# Patient Record
Sex: Female | Born: 1985 | Race: White | Hispanic: No | Marital: Single
Health system: Southern US, Community
[De-identification: ages and names within clinical notes are randomized; demographics above are authoritative.]

---
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
## 2005-04-09 ENCOUNTER — Ambulatory Visit: Payer: Self-pay | Admitting: Obstetrics & Gynecology

## 2006-05-01 ENCOUNTER — Emergency Department: Payer: Self-pay | Admitting: Emergency Medicine

## 2006-06-25 ENCOUNTER — Emergency Department: Payer: Self-pay | Admitting: Emergency Medicine

## 2006-10-29 ENCOUNTER — Ambulatory Visit: Payer: Self-pay | Admitting: Gynecologic Oncology

## 2007-05-18 ENCOUNTER — Emergency Department: Payer: Self-pay | Admitting: Unknown Physician Specialty

## 2007-06-04 ENCOUNTER — Ambulatory Visit: Payer: Self-pay | Admitting: Unknown Physician Specialty

## 2007-09-25 ENCOUNTER — Emergency Department: Payer: Self-pay | Admitting: Emergency Medicine

## 2007-09-27 ENCOUNTER — Inpatient Hospital Stay: Payer: Self-pay | Admitting: Otolaryngology

## 2007-12-03 IMAGING — US US PELV - US TRANSVAGINAL
1 series · 11 of 11 positions shown · non-contrast
Comparison: none

REASON FOR EXAM: pelvic pain    Per Bl[facility] Jumper send Begga to [facility]
COMMENTS:

[Series 1: us pelv - us transvaginal · 11 of 11 slices shown]
[im 1/11]
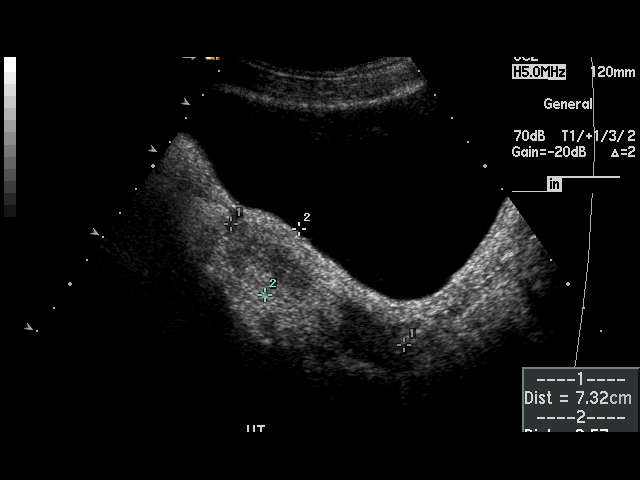
[im 2/11]
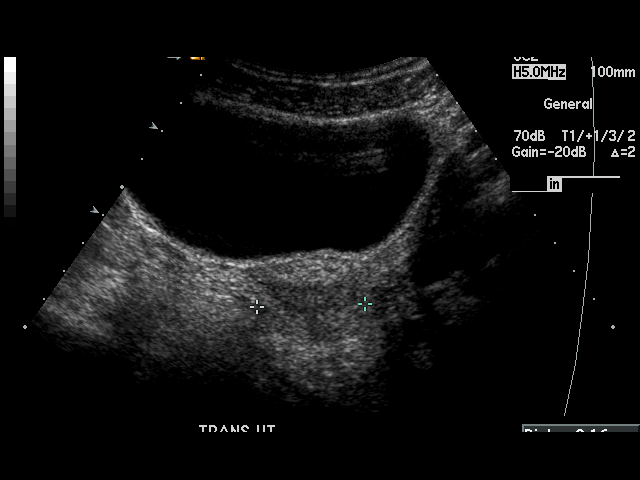
[im 3/11]
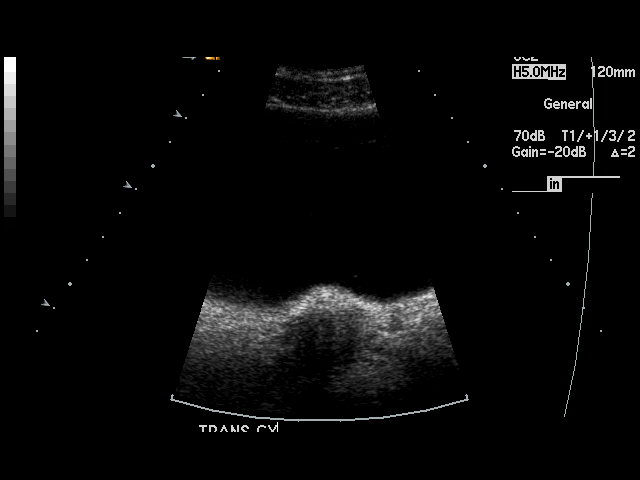
[im 4/11]
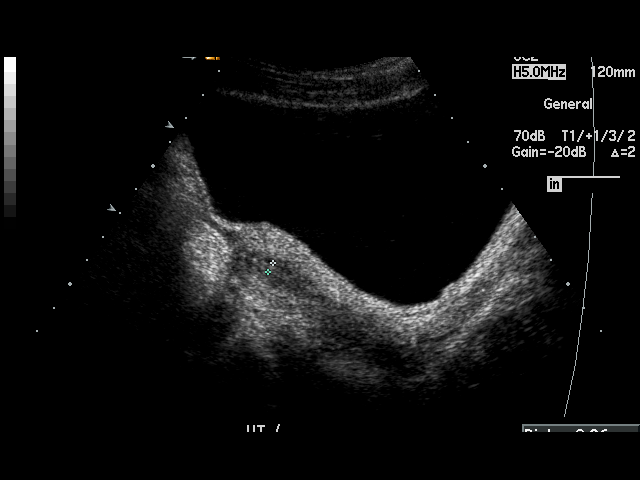
[im 5/11]
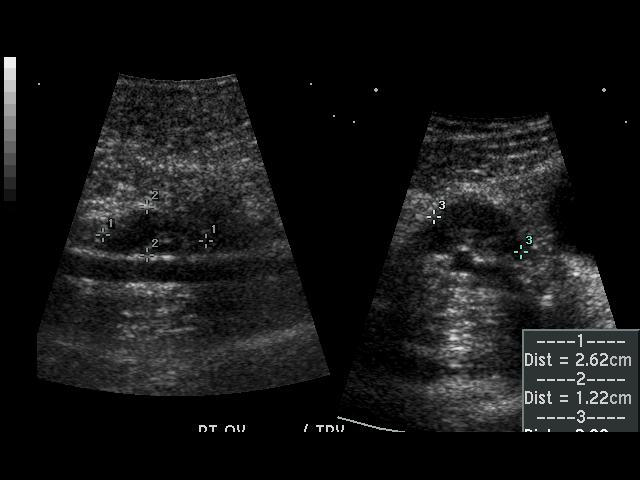
[im 6/11]
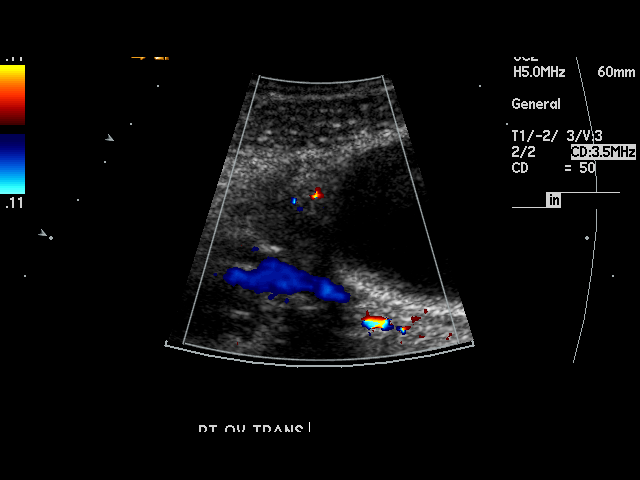
[im 7/11]
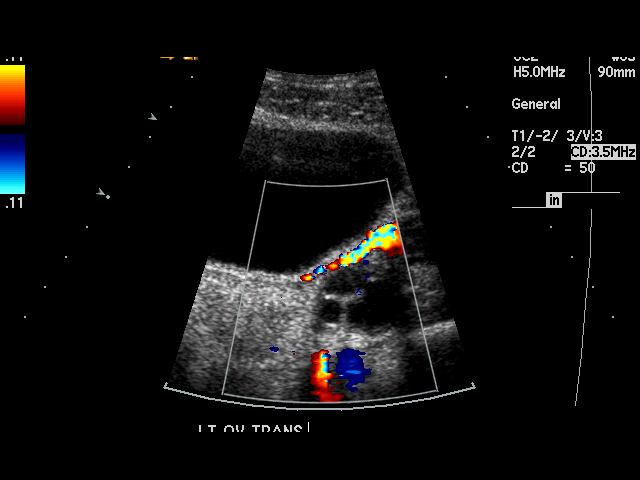
[im 8/11]
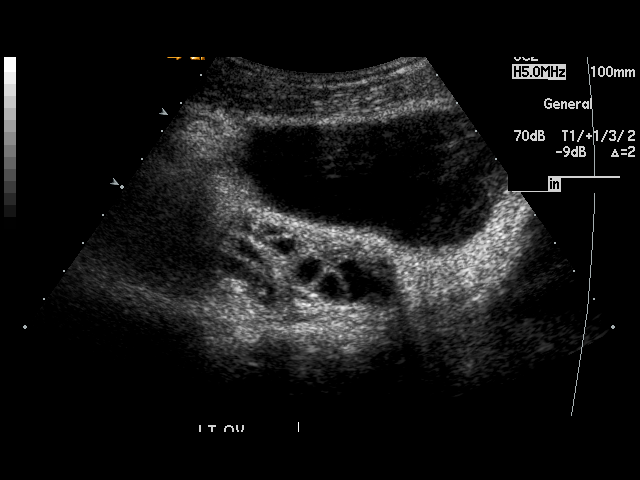
[im 9/11]
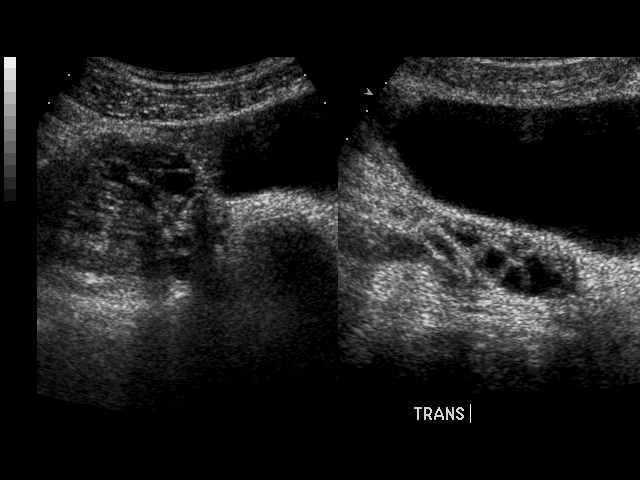
[im 10/11]
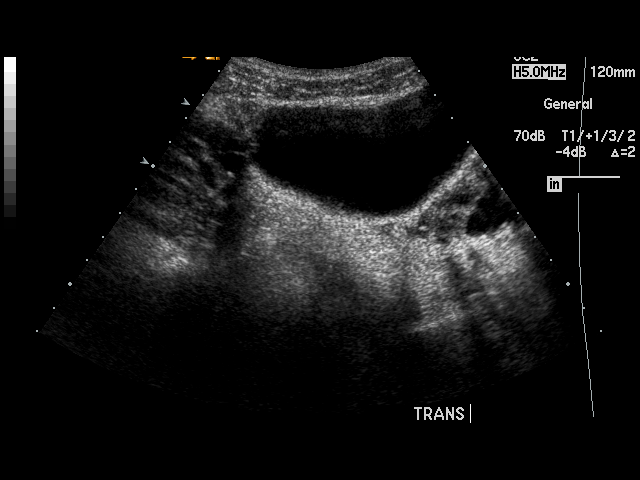
[im 11/11]
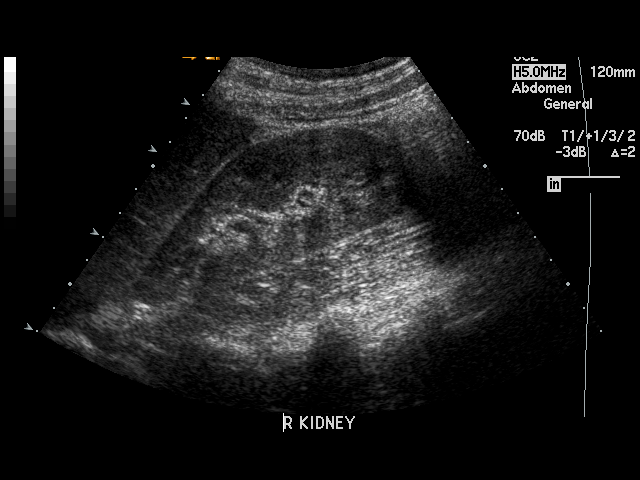

[11 of 11 positions shown; findings below may reference images not displayed]

PROCEDURE:     US  - US PELVIS MASS EXAM  - [DATE] [DATE] [DATE]  [DATE]

RESULT:     Transabdominal pelvic ultrasound was performed. The uterus
measures 7.32 cm x 2.57 cm x 3.16 cm.  No uterine mass lesions are seen. The
endometrium measures 3.6 mm in thickness. The RIGHT and LEFT ovaries are
visualized. The RIGHT ovary measures 2.62 cm at maximum diameter and the
LEFT ovary measures 3.47 cm at maximum diameter. Multiple follicular cysts
are seen in the LEFT ovary. No abnormal adnexal masses are identified. There
is no free fluid in the pelvis. The visualized portions of the urinary
bladder are normal appearance.
IMPRESSION: 1)There are multiple cysts of the LEFT ovary with the largest measuring
cm at maximum diameter and which are considered consistent with follicular
cysts. No follicular cysts are identified in the RIGHT ovary.
2. No uterine mass lesions are seen.
3. There is no free fluid observed in the pelvis.

## 2007-12-19 ENCOUNTER — Emergency Department: Payer: Self-pay | Admitting: Emergency Medicine

## 2008-01-01 ENCOUNTER — Emergency Department: Payer: Self-pay | Admitting: Emergency Medicine

## 2008-02-17 ENCOUNTER — Emergency Department: Payer: Self-pay | Admitting: Internal Medicine

## 2008-06-14 ENCOUNTER — Observation Stay: Payer: Self-pay

## 2011-12-06 ENCOUNTER — Emergency Department: Payer: Self-pay | Admitting: Emergency Medicine

## 2012-08-16 ENCOUNTER — Emergency Department: Payer: Self-pay | Admitting: Internal Medicine

## 2013-02-14 IMAGING — CR DG CHEST 2V
1 series · 2 of 2 positions shown · non-contrast
Comparison: none

REASON FOR EXAM: cough/wheezing
COMMENTS:

PROCEDURE:     DXR - DXR CHEST PA (OR AP) AND LATERAL  - August 16, 2012  [DATE]
RESULT:     Comparison: None.

[Series 1: w chest pa · 0.14mm/px · 2 of 2 slices shown]
[im 1/2]
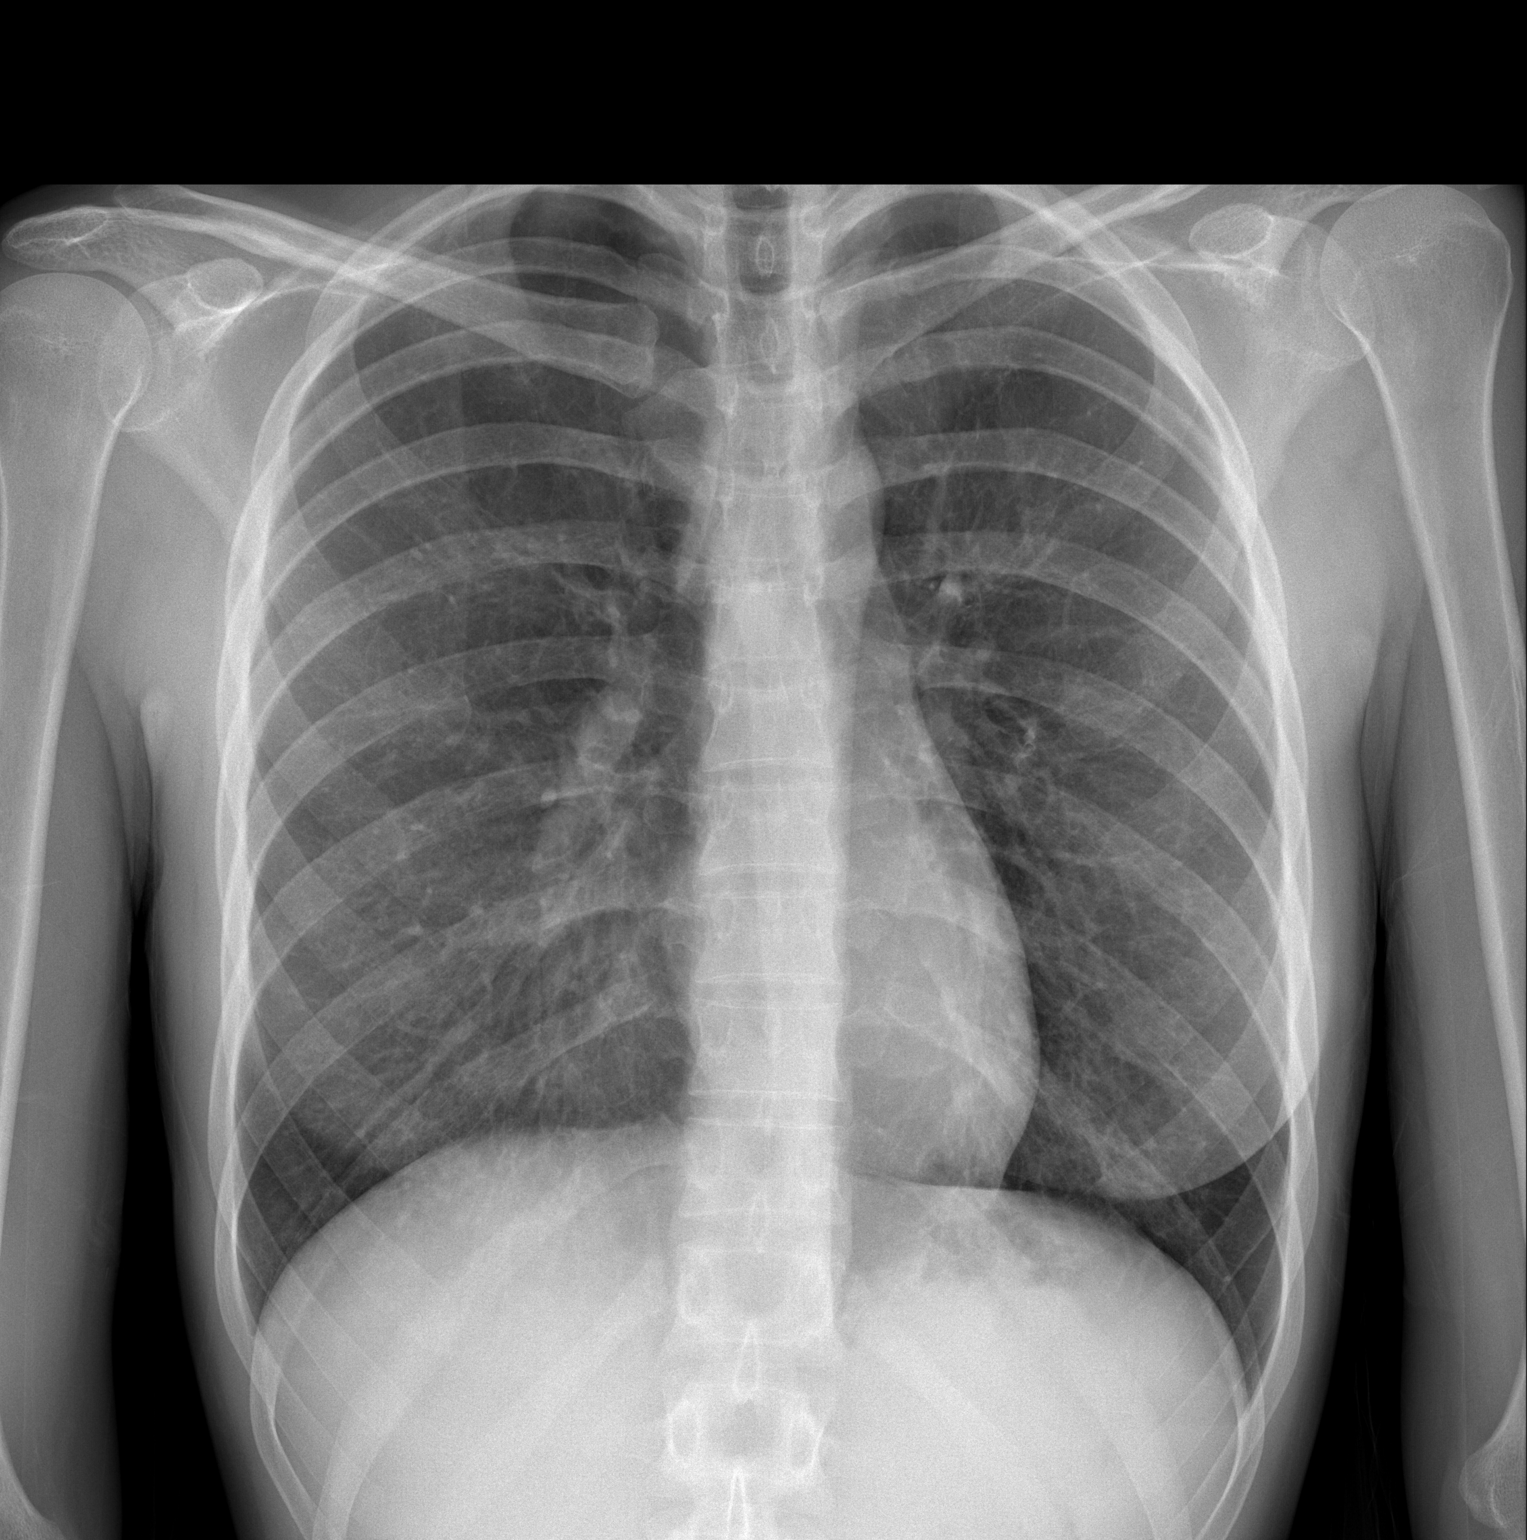
[im 2/2]
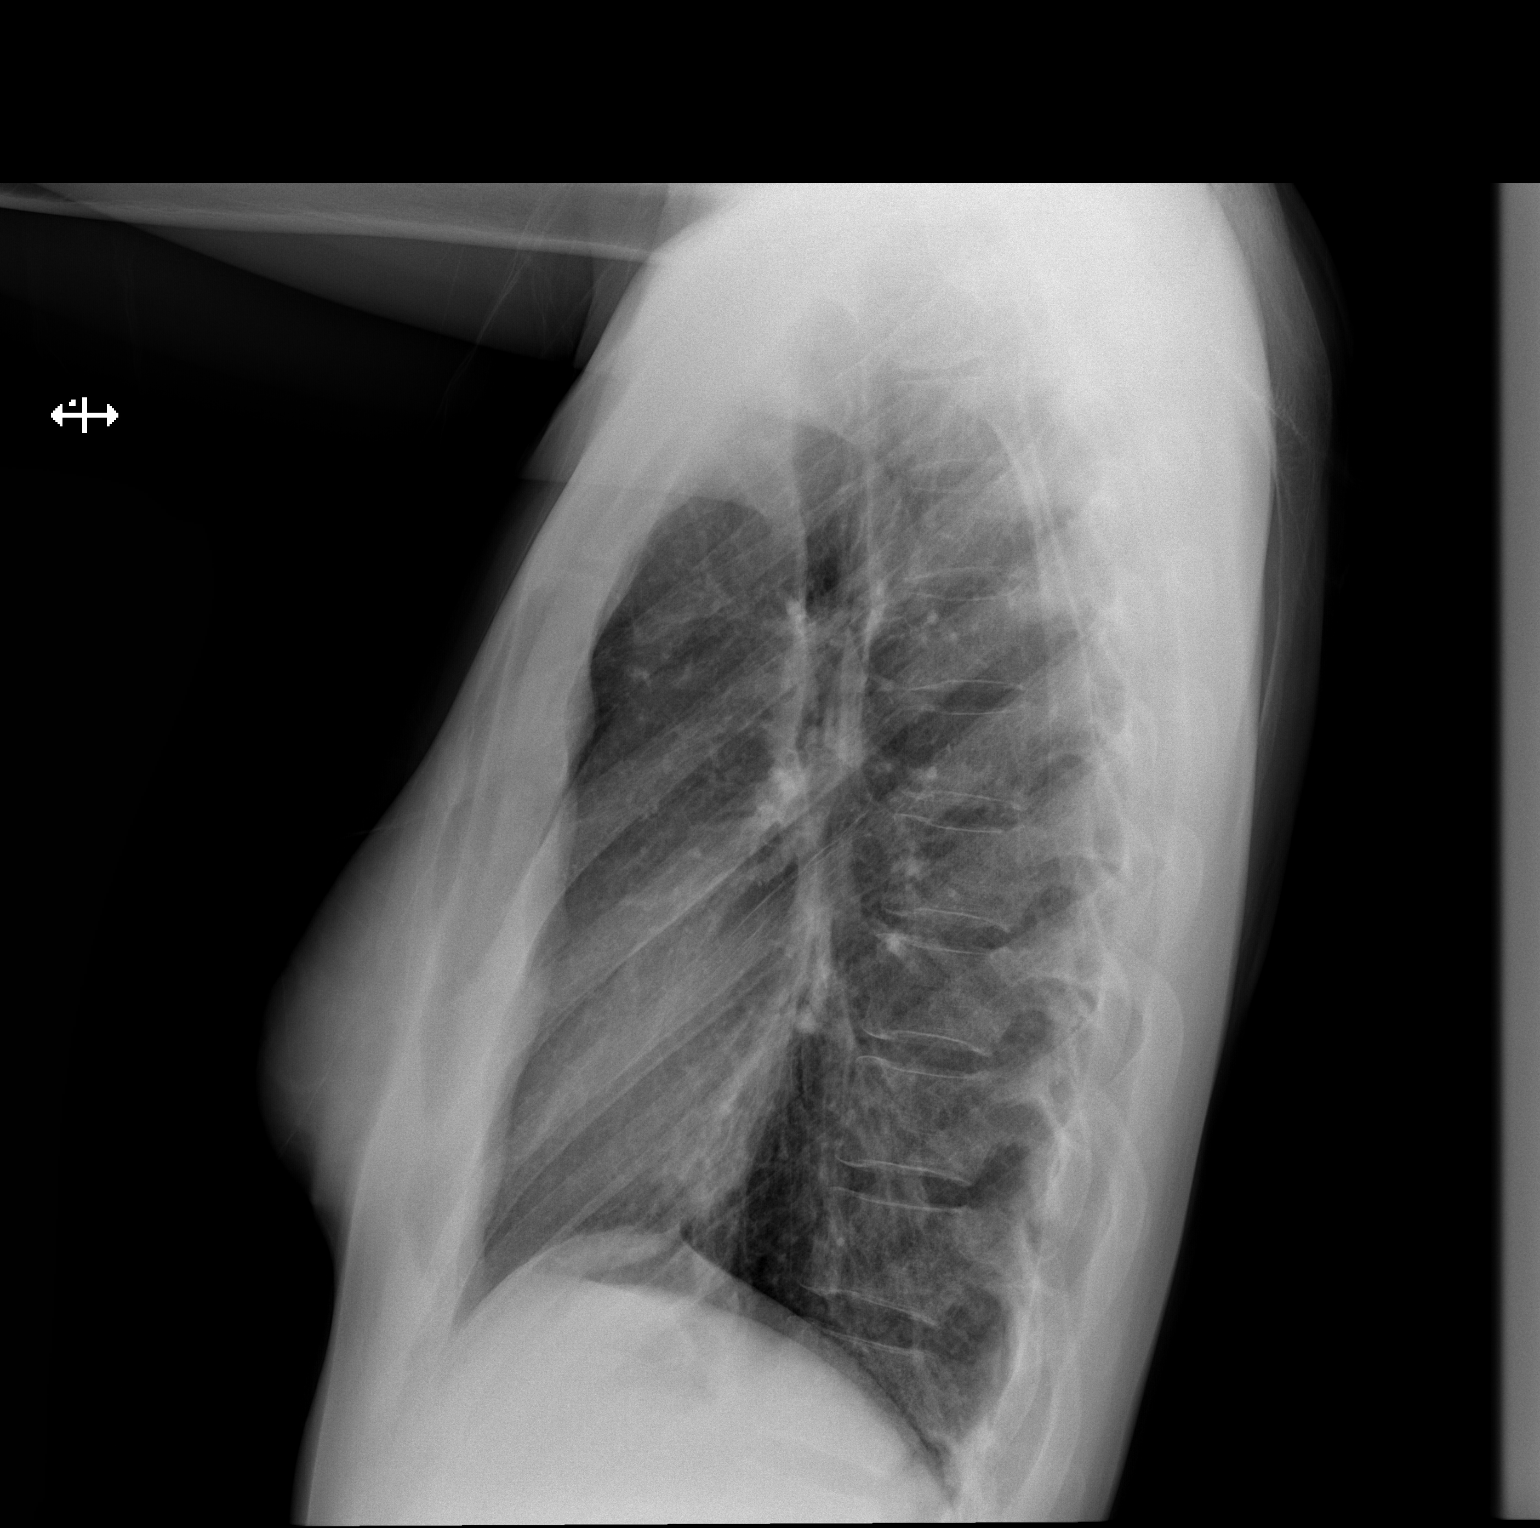

[2 of 2 positions shown; findings below may reference images not displayed]

FINDINGS: The heart and mediastinum are within normal limits. There is a subtle
opacity along the posterior aspect of the heart lateral view which could
represent a small area of atelectasis or early infection. This may correlate
with the left lower lobe on the frontal view.
IMPRESSION: Subtle area of opacity, possibly in the left lower lobe, may represent
atelectasis or early infection. If there is continued symptomatology,
followup radiographs could be performed.

[REDACTED]

## 2013-06-17 LAB — COMPREHENSIVE METABOLIC PANEL
Albumin: 4.1 g/dL (ref 3.4–5.0)
Alkaline Phosphatase: 79 U/L (ref 50–136)
Anion Gap: 9 (ref 7–16)
BUN: 4 mg/dL — ABNORMAL LOW (ref 7–18)
Bilirubin,Total: 0.3 mg/dL (ref 0.2–1.0)
Calcium, Total: 8.7 mg/dL (ref 8.5–10.1)
Chloride: 110 mmol/L — ABNORMAL HIGH (ref 98–107)
Glucose: 93 mg/dL (ref 65–99)
Osmolality: 282 (ref 275–301)
Potassium: 3.4 mmol/L — ABNORMAL LOW (ref 3.5–5.1)
SGPT (ALT): 26 U/L (ref 12–78)
Sodium: 143 mmol/L (ref 136–145)
Total Protein: 8 g/dL (ref 6.4–8.2)

## 2013-06-17 LAB — URINALYSIS, COMPLETE
Glucose,UR: NEGATIVE mg/dL (ref 0–75)
Nitrite: NEGATIVE
Ph: 7 (ref 4.5–8.0)
Protein: NEGATIVE
RBC,UR: <1 /HPF (ref 0–5)
Specific Gravity: 1.002 (ref 1.003–1.030)
WBC UR: <1 /HPF (ref 0–5)

## 2013-06-17 LAB — CBC
HCT: 40.4 % (ref 35.0–47.0)
HGB: 13.9 g/dL (ref 12.0–16.0)
MCH: 31.5 pg (ref 26.0–34.0)
MCHC: 34.4 g/dL (ref 32.0–36.0)
MCV: 92 fL (ref 80–100)
Platelet: 163 10*3/uL (ref 150–440)
RBC: 4.42 10*6/uL (ref 3.80–5.20)
RDW: 12.9 % (ref 11.5–14.5)
WBC: 5.4 10*3/uL (ref 3.6–11.0)

## 2013-06-17 LAB — DRUG SCREEN, URINE
Barbiturates, Ur Screen: NEGATIVE (ref ?–200)
Benzodiazepine, Ur Scrn: NEGATIVE (ref ?–200)
Cocaine Metabolite,Ur ~~LOC~~: NEGATIVE (ref ?–300)
MDMA (Ecstasy)Ur Screen: NEGATIVE (ref ?–500)
Methadone, Ur Screen: NEGATIVE (ref ?–300)
Opiate, Ur Screen: NEGATIVE (ref ?–300)
Tricyclic, Ur Screen: NEGATIVE (ref ?–1000)

## 2013-06-17 LAB — ACETAMINOPHEN LEVEL: Acetaminophen: <2 ug/mL

## 2013-06-17 LAB — ETHANOL
Ethanol %: 0.182 % — ABNORMAL HIGH (ref 0.000–0.080)
Ethanol: 182 mg/dL

## 2013-06-17 LAB — PREGNANCY, URINE: Pregnancy Test, Urine: NEGATIVE m[IU]/mL

## 2013-06-17 LAB — SALICYLATE LEVEL: Salicylates, Serum: 3.9 mg/dL — ABNORMAL HIGH

## 2013-06-18 ENCOUNTER — Inpatient Hospital Stay: Payer: Self-pay | Admitting: Psychiatry

## 2014-03-14 ENCOUNTER — Emergency Department: Payer: Self-pay | Admitting: Emergency Medicine

## 2014-03-14 LAB — COMPREHENSIVE METABOLIC PANEL
ANION GAP: 4 — AB (ref 7–16)
Albumin: 4 g/dL (ref 3.4–5.0)
Alkaline Phosphatase: 51 U/L
BUN: 6 mg/dL — ABNORMAL LOW (ref 7–18)
Bilirubin,Total: 0.6 mg/dL (ref 0.2–1.0)
CHLORIDE: 106 mmol/L (ref 98–107)
Calcium, Total: 8.6 mg/dL (ref 8.5–10.1)
Co2: 25 mmol/L (ref 21–32)
Creatinine: 0.68 mg/dL (ref 0.60–1.30)
EGFR (African American): >60
Glucose: 88 mg/dL (ref 65–99)
OSMOLALITY: 267 (ref 275–301)
Potassium: 3.6 mmol/L (ref 3.5–5.1)
SGOT(AST): 24 U/L (ref 15–37)
SGPT (ALT): 24 U/L (ref 12–78)
Sodium: 135 mmol/L — ABNORMAL LOW (ref 136–145)
TOTAL PROTEIN: 7.6 g/dL (ref 6.4–8.2)

## 2014-03-14 LAB — URINALYSIS, COMPLETE
BILIRUBIN, UR: NEGATIVE
Bacteria: NONE SEEN
Glucose,UR: NEGATIVE mg/dL (ref 0–75)
Ketone: NEGATIVE
NITRITE: NEGATIVE
Ph: 7 (ref 4.5–8.0)
Protein: NEGATIVE
Specific Gravity: 1.017 (ref 1.003–1.030)
WBC UR: 2 /HPF (ref 0–5)

## 2014-03-14 LAB — CBC WITH DIFFERENTIAL/PLATELET
BASOS PCT: 0.4 %
Basophil #: 0 10*3/uL (ref 0.0–0.1)
EOS ABS: 0 10*3/uL (ref 0.0–0.7)
Eosinophil %: 0.3 %
HCT: 36.7 % (ref 35.0–47.0)
HGB: 11.9 g/dL — ABNORMAL LOW (ref 12.0–16.0)
LYMPHS PCT: 30.6 %
Lymphocyte #: 1.5 10*3/uL (ref 1.0–3.6)
MCH: 29.5 pg (ref 26.0–34.0)
MCHC: 32.5 g/dL (ref 32.0–36.0)
MCV: 91 fL (ref 80–100)
MONO ABS: 0.4 x10 3/mm (ref 0.2–0.9)
Monocyte %: 8.7 %
Neutrophil #: 3 10*3/uL (ref 1.4–6.5)
Neutrophil %: 60 %
Platelet: 160 10*3/uL (ref 150–440)
RBC: 4.04 10*6/uL (ref 3.80–5.20)
RDW: 14.5 % (ref 11.5–14.5)
WBC: 4.9 10*3/uL (ref 3.6–11.0)

## 2014-03-29 ENCOUNTER — Emergency Department: Payer: Self-pay | Admitting: Emergency Medicine

## 2014-03-29 LAB — URINALYSIS, COMPLETE
BILIRUBIN, UR: NEGATIVE
Blood: NEGATIVE
GLUCOSE, UR: NEGATIVE mg/dL (ref 0–75)
LEUKOCYTE ESTERASE: NEGATIVE
NITRITE: NEGATIVE
Ph: 6 (ref 4.5–8.0)
Protein: NEGATIVE
RBC,UR: <1 /HPF (ref 0–5)
Specific Gravity: 1.019 (ref 1.003–1.030)
Squamous Epithelial: 1

## 2014-03-29 LAB — COMPREHENSIVE METABOLIC PANEL
ALT: 18 U/L (ref 12–78)
Albumin: 4.1 g/dL (ref 3.4–5.0)
Alkaline Phosphatase: 49 U/L
Anion Gap: 8 (ref 7–16)
BILIRUBIN TOTAL: 0.6 mg/dL (ref 0.2–1.0)
BUN: 5 mg/dL — AB (ref 7–18)
CALCIUM: 8.7 mg/dL (ref 8.5–10.1)
Chloride: 108 mmol/L — ABNORMAL HIGH (ref 98–107)
Co2: 21 mmol/L (ref 21–32)
Creatinine: 0.36 mg/dL — ABNORMAL LOW (ref 0.60–1.30)
EGFR (African American): >60
EGFR (Non-African Amer.): >60
GLUCOSE: 92 mg/dL (ref 65–99)
OSMOLALITY: 271 (ref 275–301)
Potassium: 3.4 mmol/L — ABNORMAL LOW (ref 3.5–5.1)
SGOT(AST): 19 U/L (ref 15–37)
SODIUM: 137 mmol/L (ref 136–145)
TOTAL PROTEIN: 7.8 g/dL (ref 6.4–8.2)

## 2014-03-29 LAB — CBC WITH DIFFERENTIAL/PLATELET
Basophil #: 0 10*3/uL (ref 0.0–0.1)
Basophil %: 0.2 %
EOS PCT: 0.6 %
Eosinophil #: 0 10*3/uL (ref 0.0–0.7)
HCT: 37.7 % (ref 35.0–47.0)
HGB: 12.9 g/dL (ref 12.0–16.0)
LYMPHS ABS: 1.8 10*3/uL (ref 1.0–3.6)
Lymphocyte %: 28.8 %
MCH: 31.1 pg (ref 26.0–34.0)
MCHC: 34.2 g/dL (ref 32.0–36.0)
MCV: 91 fL (ref 80–100)
MONOS PCT: 6.7 %
Monocyte #: 0.4 x10 3/mm (ref 0.2–0.9)
Neutrophil #: 4 10*3/uL (ref 1.4–6.5)
Neutrophil %: 63.7 %
Platelet: 169 10*3/uL (ref 150–440)
RBC: 4.16 10*6/uL (ref 3.80–5.20)
RDW: 14.2 % (ref 11.5–14.5)
WBC: 6.2 10*3/uL (ref 3.6–11.0)

## 2014-03-29 LAB — HCG, QUANTITATIVE, PREGNANCY: BETA HCG, QUANT.: 87942 m[IU]/mL — AB

## 2014-03-29 LAB — LIPASE, BLOOD: Lipase: 170 U/L (ref 73–393)

## 2014-04-27 ENCOUNTER — Emergency Department: Payer: Self-pay | Admitting: Emergency Medicine

## 2014-04-27 LAB — URINALYSIS, COMPLETE
BACTERIA: NONE SEEN
Bilirubin,UR: NEGATIVE
Blood: NEGATIVE
GLUCOSE, UR: NEGATIVE mg/dL (ref 0–75)
Leukocyte Esterase: NEGATIVE
Nitrite: NEGATIVE
PROTEIN: NEGATIVE
Ph: 6 (ref 4.5–8.0)
RBC,UR: 2 /HPF (ref 0–5)
Specific Gravity: 1.026 (ref 1.003–1.030)
Squamous Epithelial: 7
WBC UR: 5 /HPF (ref 0–5)

## 2014-04-27 LAB — COMPREHENSIVE METABOLIC PANEL
ANION GAP: 7 (ref 7–16)
Albumin: 3.9 g/dL (ref 3.4–5.0)
Alkaline Phosphatase: 56 U/L
BILIRUBIN TOTAL: 0.7 mg/dL (ref 0.2–1.0)
BUN: 8 mg/dL (ref 7–18)
CREATININE: 0.56 mg/dL — AB (ref 0.60–1.30)
Calcium, Total: 9.4 mg/dL (ref 8.5–10.1)
Chloride: 102 mmol/L (ref 98–107)
Co2: 26 mmol/L (ref 21–32)
EGFR (African American): >60
Glucose: 109 mg/dL — ABNORMAL HIGH (ref 65–99)
OSMOLALITY: 269 (ref 275–301)
Potassium: 3.4 mmol/L — ABNORMAL LOW (ref 3.5–5.1)
SGOT(AST): 15 U/L (ref 15–37)
SGPT (ALT): 17 U/L (ref 12–78)
Sodium: 135 mmol/L — ABNORMAL LOW (ref 136–145)
Total Protein: 8.1 g/dL (ref 6.4–8.2)

## 2014-04-27 LAB — CBC WITH DIFFERENTIAL/PLATELET
BASOS ABS: 0 10*3/uL (ref 0.0–0.1)
Basophil %: 0.1 %
EOS ABS: 0 10*3/uL (ref 0.0–0.7)
EOS PCT: 0.1 %
HCT: 37.6 % (ref 35.0–47.0)
HGB: 13 g/dL (ref 12.0–16.0)
Lymphocyte #: 0.5 10*3/uL — ABNORMAL LOW (ref 1.0–3.6)
Lymphocyte %: 8 %
MCH: 31.5 pg (ref 26.0–34.0)
MCHC: 34.7 g/dL (ref 32.0–36.0)
MCV: 91 fL (ref 80–100)
MONOS PCT: 3.8 %
Monocyte #: 0.2 x10 3/mm (ref 0.2–0.9)
NEUTROS PCT: 88 %
Neutrophil #: 5.7 10*3/uL (ref 1.4–6.5)
PLATELETS: 172 10*3/uL (ref 150–440)
RBC: 4.14 10*6/uL (ref 3.80–5.20)
RDW: 13.5 % (ref 11.5–14.5)
WBC: 6.5 10*3/uL (ref 3.6–11.0)

## 2014-06-07 ENCOUNTER — Emergency Department: Payer: Self-pay | Admitting: Emergency Medicine

## 2014-06-07 LAB — URINALYSIS, COMPLETE
Bilirubin,UR: NEGATIVE
Blood: NEGATIVE
Glucose,UR: NEGATIVE mg/dL (ref 0–75)
Leukocyte Esterase: NEGATIVE
Nitrite: NEGATIVE
PH: 5 (ref 4.5–8.0)
Protein: 30
RBC,UR: 3 /HPF (ref 0–5)
Specific Gravity: 1.026 (ref 1.003–1.030)
Squamous Epithelial: 3

## 2014-06-07 LAB — COMPREHENSIVE METABOLIC PANEL
ALK PHOS: 61 U/L
Albumin: 3.4 g/dL (ref 3.4–5.0)
Anion Gap: 14 (ref 7–16)
BILIRUBIN TOTAL: 0.6 mg/dL (ref 0.2–1.0)
BUN: 3 mg/dL — AB (ref 7–18)
Calcium, Total: 9 mg/dL (ref 8.5–10.1)
Chloride: 100 mmol/L (ref 98–107)
Co2: 20 mmol/L — ABNORMAL LOW (ref 21–32)
Creatinine: 0.5 mg/dL — ABNORMAL LOW (ref 0.60–1.30)
EGFR (African American): >60
EGFR (Non-African Amer.): >60
Glucose: 103 mg/dL — ABNORMAL HIGH (ref 65–99)
Osmolality: 265 (ref 275–301)
Potassium: 3 mmol/L — ABNORMAL LOW (ref 3.5–5.1)
SGOT(AST): 16 U/L (ref 15–37)
SGPT (ALT): 18 U/L (ref 12–78)
Sodium: 134 mmol/L — ABNORMAL LOW (ref 136–145)
Total Protein: 7.7 g/dL (ref 6.4–8.2)

## 2014-06-07 LAB — CBC WITH DIFFERENTIAL/PLATELET
BASOS PCT: 0.2 %
Basophil #: 0 10*3/uL (ref 0.0–0.1)
EOS ABS: 0 10*3/uL (ref 0.0–0.7)
EOS PCT: 0.1 %
HCT: 35 % (ref 35.0–47.0)
HGB: 12 g/dL (ref 12.0–16.0)
Lymphocyte #: 0.3 10*3/uL — ABNORMAL LOW (ref 1.0–3.6)
Lymphocyte %: 2.8 %
MCH: 31.3 pg (ref 26.0–34.0)
MCHC: 34.2 g/dL (ref 32.0–36.0)
MCV: 92 fL (ref 80–100)
Monocyte #: 0.3 x10 3/mm (ref 0.2–0.9)
Monocyte %: 3.4 %
NEUTROS PCT: 93.5 %
Neutrophil #: 8.6 10*3/uL — ABNORMAL HIGH (ref 1.4–6.5)
Platelet: 171 10*3/uL (ref 150–440)
RBC: 3.82 10*6/uL (ref 3.80–5.20)
RDW: 12.7 % (ref 11.5–14.5)
WBC: 9.2 10*3/uL (ref 3.6–11.0)

## 2014-06-07 LAB — HCG, QUANTITATIVE, PREGNANCY: Beta Hcg, Quant.: 26045 m[IU]/mL — ABNORMAL HIGH

## 2014-06-09 LAB — URINE CULTURE

## 2014-10-13 ENCOUNTER — Observation Stay: Payer: Self-pay

## 2014-10-13 LAB — URINALYSIS, COMPLETE
BILIRUBIN, UR: NEGATIVE
GLUCOSE, UR: NEGATIVE mg/dL (ref 0–75)
KETONE: NEGATIVE
NITRITE: NEGATIVE
Ph: 6 (ref 4.5–8.0)
Protein: NEGATIVE
RBC,UR: 6 /HPF (ref 0–5)
SPECIFIC GRAVITY: 1.011 (ref 1.003–1.030)
Squamous Epithelial: 46
WBC UR: 150 /HPF (ref 0–5)

## 2014-10-14 LAB — URINE CULTURE

## 2014-10-28 ENCOUNTER — Observation Stay: Payer: Self-pay | Admitting: Obstetrics and Gynecology

## 2014-10-30 ENCOUNTER — Inpatient Hospital Stay: Payer: Self-pay

## 2014-10-30 LAB — CBC WITH DIFFERENTIAL/PLATELET
Basophil #: 0 10*3/uL (ref 0.0–0.1)
Basophil %: 0.1 %
EOS PCT: 0.3 %
Eosinophil #: 0 10*3/uL (ref 0.0–0.7)
HCT: 29.3 % — AB (ref 35.0–47.0)
HGB: 9.6 g/dL — ABNORMAL LOW (ref 12.0–16.0)
LYMPHS ABS: 1.8 10*3/uL (ref 1.0–3.6)
Lymphocyte %: 11.5 %
MCH: 27.8 pg (ref 26.0–34.0)
MCHC: 32.9 g/dL (ref 32.0–36.0)
MCV: 85 fL (ref 80–100)
MONOS PCT: 6.8 %
Monocyte #: 1.1 x10 3/mm — ABNORMAL HIGH (ref 0.2–0.9)
NEUTROS PCT: 81.3 %
Neutrophil #: 12.5 10*3/uL — ABNORMAL HIGH (ref 1.4–6.5)
Platelet: 299 10*3/uL (ref 150–440)
RBC: 3.46 10*6/uL — ABNORMAL LOW (ref 3.80–5.20)
RDW: 14.9 % — ABNORMAL HIGH (ref 11.5–14.5)
WBC: 15.4 10*3/uL — ABNORMAL HIGH (ref 3.6–11.0)

## 2014-10-31 LAB — HEMATOCRIT: HCT: 26.6 % — ABNORMAL LOW (ref 35.0–47.0)

## 2014-12-14 ENCOUNTER — Ambulatory Visit: Payer: Self-pay | Admitting: Obstetrics & Gynecology

## 2014-12-14 LAB — CBC WITH DIFFERENTIAL/PLATELET
Basophil #: 0 10*3/uL (ref 0.0–0.1)
Basophil %: 0.4 %
EOS PCT: 1.4 %
Eosinophil #: 0.1 10*3/uL (ref 0.0–0.7)
HCT: 38.6 % (ref 35.0–47.0)
HGB: 12.3 g/dL (ref 12.0–16.0)
Lymphocyte #: 1.5 10*3/uL (ref 1.0–3.6)
Lymphocyte %: 40.9 %
MCH: 27 pg (ref 26.0–34.0)
MCHC: 31.9 g/dL — ABNORMAL LOW (ref 32.0–36.0)
MCV: 85 fL (ref 80–100)
MONO ABS: 0.3 x10 3/mm (ref 0.2–0.9)
MONOS PCT: 8.1 %
NEUTROS ABS: 1.8 10*3/uL (ref 1.4–6.5)
Neutrophil %: 49.2 %
PLATELETS: 210 10*3/uL (ref 150–440)
RBC: 4.55 10*6/uL (ref 3.80–5.20)
RDW: 16.6 % — ABNORMAL HIGH (ref 11.5–14.5)
WBC: 3.7 10*3/uL (ref 3.6–11.0)

## 2014-12-14 LAB — BASIC METABOLIC PANEL
ANION GAP: 2 — AB (ref 7–16)
BUN: 7 mg/dL (ref 7–18)
CALCIUM: 8.6 mg/dL (ref 8.5–10.1)
CO2: 30 mmol/L (ref 21–32)
Chloride: 108 mmol/L — ABNORMAL HIGH (ref 98–107)
Creatinine: 0.67 mg/dL (ref 0.60–1.30)
EGFR (African American): >60
EGFR (Non-African Amer.): >60
Glucose: 79 mg/dL (ref 65–99)
OSMOLALITY: 276 (ref 275–301)
Potassium: 3.9 mmol/L (ref 3.5–5.1)
Sodium: 140 mmol/L (ref 136–145)

## 2014-12-23 ENCOUNTER — Ambulatory Visit: Payer: Self-pay | Admitting: Obstetrics & Gynecology
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
## 2015-04-03 LAB — SURGICAL PATHOLOGY

## 2015-04-09 NOTE — Op Note (Signed)
PATIENT NAME:  Marisa Turner, Marisa Turner MR#:  [id number] DATE OF BIRTH:  1985-12-23  DATE OF PROCEDURE:  12/23/2014  PREOPERATIVE DIAGNOSIS: Desired permanent sterilization.  POSTOPERATIVE DIAGNOSIS: Desired permanent sterilization.  PROCEDURE: Laparoscopic bilateral salpingectomy.   SURGEON: Ranae Plumberhelsea Ward, MD  ANESTHESIA: General.   ESTIMATED BLOOD LOSS: Minimal.  OPERATIVE FLUIDS: 600 mL crystalloid.   URINE OUTPUT: 400 mL clear yellow fluid.   FINDINGS: 1.  Normal-appearing pelvic anatomy, uterus, tubes and ovaries.  2.  Slightly enlarged inguinal canal orifice on the patient's right.  SPECIMENS: 1.  Left tube.  2.  Right tube.  COMPLICATIONS: None apparent.   DISPOSITION: Stable to PACU for recovery.   OPERATIVE INDICATION FOR PROCEDURE: Marisa Alstrommanda Turner is status post vaginal delivery approximately 8 weeks and she desires permanent sterilization. She does have a history of ovarian cancer in her family and she wished to have her tubes removed completely. She was certain of her desire for nonreversible permanent sterilization. She was counseled, informed consent was signed, and she was brought to the OR.  DESCRIPTION OF PROCEDURE: The patient was identified as Marisa Turner. She was given general endotracheal intubation and placed in the dorsal lithotomy position with Allen stirrups. She was then prepped in the vagina with Betadine and on the abdomen with chlorhexidine and draped in the normal sterile fashion. A timeout was called. Graves speculum was placed into the vagina to visualize the cervix, which was grasped with a single-tooth tenaculum at the 12 o'clock position. A uterine sound was then placed gently into the uterus and taped to the tenaculum in order to act as a uterine manipulator. A Foley catheter was also placed. The attention was then turned to the abdomen, after change of gloves, and a 5 mm incision was made in the umbilicus. A Veress needle was then inserted through  this, tested with a saline drop test for position, and a pneumoperitoneum was created. Opening pressure was 5 mmHg, total pressure was 15 mmHg, and once the pneumoperitoneum was created the Veress needle was removed and a Visiport was then inserted using a 5 mm scope. Two other 5mm port sites were then placed, one on the left and one on the right lower quadrants without difficulty. Inspection of the pelvis was as above. The patient was placed in Trendelenburg. The bowel was moved out of the pelvis gently and the LigaSure was used to seal and divide the left fallopian tube by grasping the fimbriated end and cutting the mesosalpinx to the area of the cornua, and the tube at its insertion into the cornea was doubly burnt and then divided. The same exact procedure was performed on the right. Both tubes were then removed from the patient's abdomen. The surgical sites were hemostatic. The pneumoperitoneum was deflated. The trocars were removed. The skin was closed using 4-0 Monocryl at each site and covered with Steri-Strips. The vaginal instruments were then removed. The patient tolerated the procedure, the counts were correct x2 and then the patient was brought to PACU in a stable condition for recovery.  ____________________________ Elenora Fenderhelsea C. Ward, MD ccw:sb D: 12/23/2014 11:08:23 ET T: 12/23/2014 12:21:19 ET JOB#: [id number]  cc: Leeroy Bockhelsea C. Ward, MD, <Dictator> Leola BrazilHELSEA C WARD MD ELECTRONICALLY SIGNED 12/23/2014 14:33
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
